# Patient Record
Sex: Male | Born: 1940 | Race: Black or African American | Hispanic: No | Smoking: Never smoker
Health system: Southern US, Community
[De-identification: ages and names within clinical notes are randomized; demographics above are authoritative.]

## PROBLEM LIST (undated history)

## (undated) DIAGNOSIS — C801 Malignant (primary) neoplasm, unspecified: Secondary | ICD-10-CM

## (undated) DIAGNOSIS — I10 Essential (primary) hypertension: Secondary | ICD-10-CM

## (undated) DIAGNOSIS — E119 Type 2 diabetes mellitus without complications: Secondary | ICD-10-CM

## (undated) DIAGNOSIS — C189 Malignant neoplasm of colon, unspecified: Secondary | ICD-10-CM

## (undated) HISTORY — PX: APPENDECTOMY: SHX54

---

## 1985-12-02 DIAGNOSIS — E1140 Type 2 diabetes mellitus with diabetic neuropathy, unspecified: Secondary | ICD-10-CM | POA: Insufficient documentation

## 2016-05-23 DIAGNOSIS — E785 Hyperlipidemia, unspecified: Secondary | ICD-10-CM | POA: Insufficient documentation

## 2016-05-23 DIAGNOSIS — G4700 Insomnia, unspecified: Secondary | ICD-10-CM | POA: Insufficient documentation

## 2016-05-23 DIAGNOSIS — F419 Anxiety disorder, unspecified: Secondary | ICD-10-CM | POA: Insufficient documentation

## 2016-05-23 DIAGNOSIS — Z85038 Personal history of other malignant neoplasm of large intestine: Secondary | ICD-10-CM | POA: Insufficient documentation

## 2016-05-23 DIAGNOSIS — M109 Gout, unspecified: Secondary | ICD-10-CM | POA: Insufficient documentation

## 2016-05-23 DIAGNOSIS — M25569 Pain in unspecified knee: Secondary | ICD-10-CM | POA: Insufficient documentation

## 2016-05-23 DIAGNOSIS — M549 Dorsalgia, unspecified: Secondary | ICD-10-CM

## 2016-05-23 DIAGNOSIS — G8929 Other chronic pain: Secondary | ICD-10-CM | POA: Insufficient documentation

## 2016-07-30 ENCOUNTER — Ambulatory Visit (INDEPENDENT_AMBULATORY_CARE_PROVIDER_SITE_OTHER): Payer: Medicare Other

## 2016-07-30 ENCOUNTER — Encounter: Payer: Self-pay | Admitting: Podiatry

## 2016-07-30 ENCOUNTER — Ambulatory Visit (INDEPENDENT_AMBULATORY_CARE_PROVIDER_SITE_OTHER): Payer: Medicare Other | Admitting: Podiatry

## 2016-07-30 VITALS — BP 146/71 | HR 73 | Resp 16

## 2016-07-30 DIAGNOSIS — B351 Tinea unguium: Secondary | ICD-10-CM

## 2016-07-30 DIAGNOSIS — E119 Type 2 diabetes mellitus without complications: Secondary | ICD-10-CM

## 2016-07-30 DIAGNOSIS — M79676 Pain in unspecified toe(s): Secondary | ICD-10-CM | POA: Diagnosis not present

## 2016-07-30 DIAGNOSIS — Z0189 Encounter for other specified special examinations: Secondary | ICD-10-CM

## 2016-07-30 DIAGNOSIS — M2040 Other hammer toe(s) (acquired), unspecified foot: Secondary | ICD-10-CM | POA: Diagnosis not present

## 2016-07-30 DIAGNOSIS — I739 Peripheral vascular disease, unspecified: Secondary | ICD-10-CM

## 2016-07-30 DIAGNOSIS — I10 Essential (primary) hypertension: Secondary | ICD-10-CM | POA: Insufficient documentation

## 2016-07-30 NOTE — Progress Notes (Signed)
   Subjective:    Patient ID: Chad Benitez., male    DOB: [birth year], 75 y.o.   MRN: [id number]  HPI: He presents today as a 75 year old male with a chief complaint of painful elongated toenails 1 through 5 bilaterally he states that he is needed from [place] and has a history of diabetes usually sees a podiatrist every 3 months. States that he would to consider a pair of diabetic shoes.    Review of Systems  Cardiovascular: Positive for leg swelling.  All other systems reviewed and are negative.      Objective:   Physical Exam: Vital signs are stable alert and oriented 3 pulses are palpable slightly diminished DP bilateral. Neurologic sensorium is intact. Degenerative flexors are intact. Muscle strength +5 over 5 dorsiflexion plantar flexors and inverters everters OF the musculature is intact. Orthopedic evaluation Mr. is all just distal to the ankle for range of motion without crepitation. Orthopedic evaluation shows all joints distal to the ankle for range of motion without crepitation however he does have flexible hammertoe deformities. No open lesions. No pre-ulcerative lesions. Radiographs taken today do not demonstrate any type of osseus abnormalities no fractures are identified. His toenails are elongated sharply incurvated and thickened.        Assessment & Plan:  Pain limb secondary to onychomycosis diabetes. Hammertoe deformities. Peripheral vascular disease.  Plan: Debridement of nails. Started paperwork requesting permission for diabetic shoes.

## 2016-11-01 ENCOUNTER — Ambulatory Visit: Payer: Medicare Other | Admitting: Podiatry

## 2016-11-04 ENCOUNTER — Ambulatory Visit (INDEPENDENT_AMBULATORY_CARE_PROVIDER_SITE_OTHER): Payer: Medicare Other | Admitting: Podiatry

## 2016-11-04 ENCOUNTER — Encounter: Payer: Self-pay | Admitting: Podiatry

## 2016-11-04 VITALS — BP 134/91 | HR 68

## 2016-11-04 DIAGNOSIS — L603 Nail dystrophy: Secondary | ICD-10-CM

## 2016-11-04 DIAGNOSIS — E0843 Diabetes mellitus due to underlying condition with diabetic autonomic (poly)neuropathy: Secondary | ICD-10-CM

## 2016-11-04 DIAGNOSIS — M79609 Pain in unspecified limb: Secondary | ICD-10-CM | POA: Diagnosis not present

## 2016-11-04 DIAGNOSIS — L608 Other nail disorders: Secondary | ICD-10-CM

## 2016-11-04 DIAGNOSIS — B351 Tinea unguium: Secondary | ICD-10-CM | POA: Diagnosis not present

## 2016-11-04 NOTE — Progress Notes (Signed)
SUBJECTIVE Patient with a history of diabetes mellitus presents to office today complaining of elongated, thickened nails. Pain while ambulating in shoes. Patient is unable to trim their own nails.   No Known Allergies  OBJECTIVE General Patient is awake, alert, and oriented x 3 and in no acute distress. Derm Skin is dry and supple bilateral. Negative open lesions or macerations. Remaining integument unremarkable. Nails are tender, long, thickened and dystrophic with subungual debris, consistent with onychomycosis, 1-5 bilateral. No signs of infection noted. Vasc  DP and PT pedal pulses palpable bilaterally. Temperature gradient within normal limits.  Neuro Epicritic and protective threshold sensation diminished bilaterally.  Musculoskeletal Exam No symptomatic pedal deformities noted bilateral. Muscular strength within normal limits.  ASSESSMENT 1. Diabetes Mellitus w/ peripheral neuropathy 2. Onychomycosis of nail due to dermatophyte bilateral 3. Pain in foot bilateral  PLAN OF CARE 1. Patient evaluated today. 2. Instructed to maintain good pedal hygiene and foot care. Stressed importance of controlling blood sugar.  3. Mechanical debridement of nails 1-5 bilaterally performed using a nail nipper. Filed with dremel without incident.  4. Return to clinic in 3 mos.    Brent M Evans, DPM   

## 2016-11-11 ENCOUNTER — Emergency Department (HOSPITAL_COMMUNITY): Payer: Medicare Other

## 2016-11-11 ENCOUNTER — Emergency Department (HOSPITAL_COMMUNITY)
Admission: EM | Admit: 2016-11-11 | Discharge: 2016-11-11 | Disposition: A | Discharge status: Home / Self Care | Payer: Medicare Other | Attending: Emergency Medicine | Admitting: Emergency Medicine

## 2016-11-11 ENCOUNTER — Encounter (HOSPITAL_COMMUNITY): Payer: Self-pay | Admitting: Emergency Medicine

## 2016-11-11 DIAGNOSIS — Y939 Activity, unspecified: Secondary | ICD-10-CM | POA: Diagnosis not present

## 2016-11-11 DIAGNOSIS — Z794 Long term (current) use of insulin: Secondary | ICD-10-CM | POA: Insufficient documentation

## 2016-11-11 DIAGNOSIS — S39012A Strain of muscle, fascia and tendon of lower back, initial encounter: Secondary | ICD-10-CM | POA: Diagnosis not present

## 2016-11-11 DIAGNOSIS — W010XXA Fall on same level from slipping, tripping and stumbling without subsequent striking against object, initial encounter: Secondary | ICD-10-CM | POA: Diagnosis not present

## 2016-11-11 DIAGNOSIS — Y999 Unspecified external cause status: Secondary | ICD-10-CM | POA: Insufficient documentation

## 2016-11-11 DIAGNOSIS — E119 Type 2 diabetes mellitus without complications: Secondary | ICD-10-CM | POA: Insufficient documentation

## 2016-11-11 DIAGNOSIS — S3992XA Unspecified injury of lower back, initial encounter: Secondary | ICD-10-CM | POA: Diagnosis present

## 2016-11-11 DIAGNOSIS — W19XXXA Unspecified fall, initial encounter: Secondary | ICD-10-CM

## 2016-11-11 DIAGNOSIS — I10 Essential (primary) hypertension: Secondary | ICD-10-CM | POA: Insufficient documentation

## 2016-11-11 DIAGNOSIS — Z7982 Long term (current) use of aspirin: Secondary | ICD-10-CM | POA: Insufficient documentation

## 2016-11-11 DIAGNOSIS — Z85038 Personal history of other malignant neoplasm of large intestine: Secondary | ICD-10-CM | POA: Insufficient documentation

## 2016-11-11 DIAGNOSIS — Y929 Unspecified place or not applicable: Secondary | ICD-10-CM | POA: Diagnosis not present

## 2016-11-11 HISTORY — DX: Malignant (primary) neoplasm, unspecified: C80.1

## 2016-11-11 HISTORY — DX: Malignant neoplasm of colon, unspecified: C18.9

## 2016-11-11 HISTORY — DX: Type 2 diabetes mellitus without complications: E11.9

## 2016-11-11 HISTORY — DX: Essential (primary) hypertension: I10

## 2016-11-11 MED ORDER — METHOCARBAMOL 500 MG PO TABS: 500.0000 mg | ORAL_TABLET | Freq: Two times a day (BID) | ORAL | 0 refills | Status: AC

## 2016-11-11 NOTE — ED Provider Notes (Signed)
Medical screening examination/treatment/procedure(s) were conducted as a shared visit with non-physician practitioner(s) and myself.  I personally evaluated the patient during the encounter.   EKG Interpretation None     Patient here complaining of mechanical fall with resultant left knee pain. X-rays negative. Had no loss of consciousness. Denies any head or neck discomfort. We'll discharge was symptomatically treatment   Lacretia Leigh, MD 11/11/16 1500

## 2016-11-11 NOTE — ED Notes (Signed)
Patient is A & O x4.  He understood discharge instructions and follow up care.

## 2016-11-11 NOTE — Discharge Instructions (Signed)
Your symptoms are most consistent with muscular spasms and pain from your fall.   Please take robaxin (muscle relaxer) as prescribed.  Do not drive after you take this medication as it may cause drowsiness.    Please return to emergency department if your symptoms worsen or if you develop numbness, tingling or weakness of your lower extremity, problems urinating like incontinence or retention.    Continue taking your home medications as prescribed.

## 2016-11-11 NOTE — ED Triage Notes (Signed)
Patient reports fall last night while taking the trash out. Patient c/o left knee and lower back pain. Ambulatory to triage. Denies head injury and LOC.

## 2016-11-11 NOTE — ED Provider Notes (Signed)
[facility] DEPT Provider Note   CSN: [id number] Arrival date & time: 11/11/16  1124  History   Chief Complaint Chief Complaint  Patient presents with  . Fall  . Back Pain  . Knee Pain    HPI Chad Benitez. is a 75 y.o. male with pmh as noted below presents with constant "tight" L back pain and L leg pain that started last night after he slipped and fell on his L side. Pt states he was holding two trash bags and slipped and landed on his left side.  Pt states moving trunk and walking exacerbates L back pan and L leg pain. Pt states staying still and not stretching decreases pain.  Pt states he has chronic knee pain, which he takes percocet for daily.  Pt has not taken any pain medications for pain since fall.  Pt denies LOC, chest pain, dizziness, light headedness, head trauma prior to fall.  Pt denies headache, blurry vision, chest pain, shortness of breath, n/v/c/d, extremity n/t/w.  Pt ambulates with cane at home.      HPI  Past Medical History:  Diagnosis Date  . Cancer ([place])   . Colon cancer ([place])   . Diabetes mellitus without complication ([place])   . Hypertension     Patient Active Problem List   Diagnosis Date Noted  . Hypertension 07/30/2016  . Anxiety 05/23/2016  . Chronic back pain 05/23/2016  . Knee pain 05/23/2016  . Gout 05/23/2016  . History of colon cancer 05/23/2016  . Hyperlipidemia 05/23/2016  . Insomnia 05/23/2016  . Type 2 diabetes mellitus with diabetic neuropathy, with long-term current use of insulin ([place]) 12/02/1985    Past Surgical History:  Procedure Laterality Date  . APPENDECTOMY         Home Medications    Prior to Admission medications   Medication Sig Start Date End Date Taking? Authorizing Provider  allopurinol (ZYLOPRIM) 100 MG tablet Take 100 mg by mouth daily.    Yes Historical Provider, MD  aspirin 81 MG chewable tablet Chew by mouth.   Yes Historical Provider, MD  gabapentin (NEURONTIN) 100 MG capsule Take 100 mg by  mouth 3 (three) times daily.  09/10/16 09/10/17 Yes Historical Provider, MD  hydrochlorothiazide (MICROZIDE) 12.5 MG capsule Take by mouth. 09/10/16 09/10/17 Yes Historical Provider, MD  insulin NPH-regular Human (NOVOLIN 70/30) (70-30) 100 UNIT/ML injection Inject 25 Units into the skin 2 (two) times daily.  06/24/16 06/24/17 Yes Historical Provider, MD  lidocaine-prilocaine (EMLA) cream Apply 1-2 grams (1-2 pea sized amounts) to affected area 3-4 times a day as needed for 30 days 08/07/16  Yes Historical Provider, MD  lisinopril (PRINIVIL,ZESTRIL) 20 MG tablet Take by mouth.   Yes Historical Provider, MD  omeprazole (PRILOSEC) 20 MG capsule TAKE 1 CAPSULE BY MOUTH DAILY 07/17/16  Yes Historical Provider, MD  oxyCODONE-acetaminophen (PERCOCET) 10-325 MG tablet Take 1 tablet by mouth every 6 (six) hours as needed for pain.  07/29/16  Yes Historical Provider, MD  QUEtiapine (SEROQUEL) 50 MG tablet Take 50 mg by mouth at bedtime.  07/20/16  Yes Historical Provider, MD  zolpidem (AMBIEN) 5 MG tablet Take 5 mg by mouth at bedtime.  07/20/16  Yes Historical Provider, MD  Diclofenac Sodium 1.5 % SOLN APPLY 40 DROPS TO AFFECTED AREA 3 TO 4 TIMES APPLY DAY 08/07/16   Historical Provider, MD  Glucose Blood (BLOOD GLUCOSE TEST STRIPS) STRP Use as instructed to test blood sugar levels up to three (3) times daily.  DX: E11.40  06/26/16 06/26/17  Historical Provider, MD  glucose monitoring kit (FREESTYLE) monitoring kit by Does not apply route.    Historical Provider, MD  Insulin Syringe-Needle U-100 (INSULIN SYRINGE .3CC/31GX5/16") 31G X 5/16" 0.3 ML MISC by Does not apply route.    Historical Provider, MD  Lancets (FREESTYLE) lancets 1 each by Other route 2 (two) times daily. Use as instructed 06/24/16 06/24/17  Historical Provider, MD  methocarbamol (ROBAXIN) 500 MG tablet Take 1 tablet (500 mg total) by mouth 2 (two) times daily. 11/11/16   Kinnie Feil, PA-C    Family History History reviewed. No pertinent family  history.  Social History Social History  Substance Use Topics  . Smoking status: Never Smoker  . Smokeless tobacco: Never Used  . Alcohol use No     Allergies   Patient has no known allergies.   Review of Systems Review of Systems  All other systems reviewed and are negative.    Physical Exam Updated Vital Signs BP 135/80   Pulse 82   Temp 98.2 F (36.8 C) (Oral)   Resp 16   Ht 6' (1.829 m)   Wt 108 kg   SpO2 97%   BMI 32.28 kg/m   Physical Exam  Constitutional: He is oriented to person, place, and time. Vital signs are normal. He appears well-developed and well-nourished. No distress.  HENT:  Head: Normocephalic and atraumatic.  Eyes: EOM are normal. Pupils are equal, round, and reactive to light.  Neck: Normal range of motion.  No cervical midline tenderness.  No cervical paraspinal muscle tenderness. Full neck ROM without pain.  Cardiovascular: Normal rate, regular rhythm, normal heart sounds and intact distal pulses.   Pulmonary/Chest: Effort normal and breath sounds normal. He has no wheezes.  Abdominal: There is no tenderness.  Musculoskeletal: Normal range of motion.  Increased muscular tone at left thoracic paraspinal muscle c/w spasms.  No midline thoracic or lumbar tenderness.  Negative SLR bilaterally.  Negative Faber bilaterally.  No deformities or evidence of trauma, warmth or edema in upper and lower extremities other than  L knee scar c/w previous knee surgery.   Muscle mass and tone are appropriate and symmetric.  Full active and passive ROM of the spine and extremities including neck, shoulders, elbows, wrists, MCPs, PIPs, PIPs, hip, knee, ankles and toes.  No crepitus noted with ROM.    Lymphadenopathy:    He has no cervical adenopathy.  Neurological: He is alert and oriented to person, place, and time.  Pt is alert and oriented.  Speech and phonation normal.  Thought process coherent.  Strength 5/5 in upper and lower extremities.  Sensation to  light touch intact in upper and lower extremities. Intact finger to nose test. Gait normal, no foot drag. CN I not tested CN II full visual fields  CN III, IV, VI PEERL and EOM intact CN V light touch intact in all 3 divisions of trigeminal nerve CN VII facial nerve movements intact, symmetric CN VIII hearing intact to finger rub CN IX, X no uvula deviation, symmetric soft palate rise CN XI 5/5 SCM and trapezius strength  CN XII Tongue midline with symmetric L/R movement    Skin: Skin is warm and dry.  Psychiatric: He has a normal mood and affect. His behavior is normal.  Nursing note and vitals reviewed.    ED Treatments / Results  Labs (all labs ordered are listed, but only abnormal results are displayed) Labs Reviewed - No data to display  EKG  EKG Interpretation None       Radiology No results found.  Procedures Procedures (including critical care time)  Medications Ordered in ED Medications - No data to display   Initial Impression / Assessment and Plan / ED Course  I have reviewed the triage vital signs and the nursing notes.  Pertinent labs & imaging results that were available during my care of the patient were reviewed by me and considered in my medical decision making (see chart for details).  Clinical Course    75 yo male presents with L back and L pain s/p fall.  Pt symptoms and physical exam findings most c/w musculoskeletal injury.  Doubt cauda equina, epidural abscess.  L knee x-ray negative for acute changes.  Lumbar x-rays not indicated as pt does not have midline tenderness.  CT head not indicated, pt denied head trauma.  Low suspicion for head trauma or bleed given normal neuro exam.  Will treat symptoms symptomatically with rest for 48 hours, mild stretches, robaxin.  Pt ready for discharge.  Pt given ED return instructions, verbalized understanding and agreeable to dispo plan.    Final Clinical Impressions(s) / ED Diagnoses   Final diagnoses:    Fall, initial encounter  Strain of lumbar region, initial encounter    New Prescriptions Discharge Medication List as of 11/11/2016  3:16 PM       Kinnie Feil, PA-C 11/13/16 1826

## 2016-11-11 NOTE — ED Notes (Signed)
Called for triage with no answer

## 2016-11-11 NOTE — ED Notes (Signed)
Patient did not wish to wait for vitals.

## 2016-12-11 ENCOUNTER — Emergency Department (HOSPITAL_COMMUNITY)
Admission: EM | Admit: 2016-12-11 | Discharge: 2016-12-11 | Disposition: A | Discharge status: Home / Self Care | Payer: Medicare Other | Attending: Emergency Medicine | Admitting: Emergency Medicine

## 2016-12-11 ENCOUNTER — Encounter (HOSPITAL_COMMUNITY): Payer: Self-pay | Admitting: Emergency Medicine

## 2016-12-11 DIAGNOSIS — Z85038 Personal history of other malignant neoplasm of large intestine: Secondary | ICD-10-CM | POA: Insufficient documentation

## 2016-12-11 DIAGNOSIS — E119 Type 2 diabetes mellitus without complications: Secondary | ICD-10-CM | POA: Insufficient documentation

## 2016-12-11 DIAGNOSIS — Z79899 Other long term (current) drug therapy: Secondary | ICD-10-CM | POA: Diagnosis not present

## 2016-12-11 DIAGNOSIS — I10 Essential (primary) hypertension: Secondary | ICD-10-CM | POA: Insufficient documentation

## 2016-12-11 DIAGNOSIS — Z7982 Long term (current) use of aspirin: Secondary | ICD-10-CM | POA: Insufficient documentation

## 2016-12-11 DIAGNOSIS — Z760 Encounter for issue of repeat prescription: Secondary | ICD-10-CM | POA: Diagnosis present

## 2016-12-11 DIAGNOSIS — Z794 Long term (current) use of insulin: Secondary | ICD-10-CM | POA: Insufficient documentation

## 2016-12-11 MED ORDER — QUETIAPINE FUMARATE 50 MG PO TABS: 50.0000 mg | ORAL_TABLET | Freq: Every day | ORAL | 0 refills | Status: AC

## 2016-12-11 MED ORDER — LISINOPRIL 20 MG PO TABS: 20.0000 mg | ORAL_TABLET | Freq: Every day | ORAL | 0 refills | Status: AC

## 2016-12-11 MED ORDER — ZOLPIDEM TARTRATE 5 MG PO TABS: 5.0000 mg | ORAL_TABLET | Freq: Every day | ORAL | 0 refills | Status: AC

## 2016-12-11 MED ORDER — QUETIAPINE FUMARATE ER 50 MG PO TB24
50.0000 mg | ORAL_TABLET | Freq: Once | ORAL | Status: AC
  Administered 2016-12-11: 50 mg via ORAL
  Filled 2016-12-11: qty 1

## 2016-12-11 NOTE — ED Triage Notes (Addendum)
Pt states he had a flight from [place] and his bag was lost with his all his prescription medications. Pt takes medication for anxiety and is starting to feel like he is going to be very emotional if he cannot get his medicine. Pt has not contacted his MD at this point. Pt states he is here for anxiety meds. Pt also has service dog with him

## 2016-12-13 NOTE — ED Provider Notes (Signed)
[facility] DEPT Provider Note   CSN: [id number] Arrival date & time: 12/11/16  1816     History   Chief Complaint No chief complaint on file.   HPI Chad Mane. is a 76 y.o. male.  HPI PT with hx of anxiety comes in requesting med refill. Pt just took a flight back to [place] from [place], and his medication bag was left with the wheel chair attendant. Pt has contacted PCP for prompt f/u - but now he is nervous.   Past Medical History:  Diagnosis Date  . Cancer ([place])   . Colon cancer ([place])   . Diabetes mellitus without complication ([place])   . Hypertension     Patient Active Problem List   Diagnosis Date Noted  . Hypertension 07/30/2016  . Anxiety 05/23/2016  . Chronic back pain 05/23/2016  . Knee pain 05/23/2016  . Gout 05/23/2016  . History of colon cancer 05/23/2016  . Hyperlipidemia 05/23/2016  . Insomnia 05/23/2016  . Type 2 diabetes mellitus with diabetic neuropathy, with long-term current use of insulin ([place]) 12/02/1985    Past Surgical History:  Procedure Laterality Date  . APPENDECTOMY         Home Medications    Prior to Admission medications   Medication Sig Start Date End Date Taking? Authorizing Provider  allopurinol (ZYLOPRIM) 100 MG tablet Take 100 mg by mouth daily.     Historical Provider, MD  aspirin 81 MG chewable tablet Chew by mouth.    Historical Provider, MD  Diclofenac Sodium 1.5 % SOLN APPLY 40 DROPS TO AFFECTED AREA 3 TO 4 TIMES APPLY DAY 08/07/16   Historical Provider, MD  gabapentin (NEURONTIN) 100 MG capsule Take 100 mg by mouth 3 (three) times daily.  09/10/16 09/10/17  Historical Provider, MD  Glucose Blood (BLOOD GLUCOSE TEST STRIPS) STRP Use as instructed to test blood sugar levels up to three (3) times daily.  DX: E11.40 06/26/16 06/26/17  Historical Provider, MD  glucose monitoring kit (FREESTYLE) monitoring kit by Does not apply route.    Historical Provider, MD  hydrochlorothiazide (MICROZIDE) 12.5 MG capsule  Take by mouth. 09/10/16 09/10/17  Historical Provider, MD  insulin NPH-regular Human (NOVOLIN 70/30) (70-30) 100 UNIT/ML injection Inject 25 Units into the skin 2 (two) times daily.  06/24/16 06/24/17  Historical Provider, MD  Insulin Syringe-Needle U-100 (INSULIN SYRINGE .3CC/31GX5/16") 31G X 5/16" 0.3 ML MISC by Does not apply route.    Historical Provider, MD  Lancets (FREESTYLE) lancets 1 each by Other route 2 (two) times daily. Use as instructed 06/24/16 06/24/17  Historical Provider, MD  lidocaine-prilocaine (EMLA) cream Apply 1-2 grams (1-2 pea sized amounts) to affected area 3-4 times a day as needed for 30 days 08/07/16   Historical Provider, MD  lisinopril (PRINIVIL,ZESTRIL) 20 MG tablet Take 1 tablet (20 mg total) by mouth daily. 12/11/16   Varney Biles, MD  methocarbamol (ROBAXIN) 500 MG tablet Take 1 tablet (500 mg total) by mouth 2 (two) times daily. 11/11/16   Kinnie Feil, PA-C  omeprazole (PRILOSEC) 20 MG capsule TAKE 1 CAPSULE BY MOUTH DAILY 07/17/16   Historical Provider, MD  oxyCODONE-acetaminophen (PERCOCET) 10-325 MG tablet Take 1 tablet by mouth every 6 (six) hours as needed for pain.  07/29/16   Historical Provider, MD  QUEtiapine (SEROQUEL) 50 MG tablet Take 1 tablet (50 mg total) by mouth at bedtime. 12/11/16   Varney Biles, MD  zolpidem (AMBIEN) 5 MG tablet Take 1 tablet (5 mg total) by mouth at bedtime.  12/11/16   Varney Biles, MD    Family History History reviewed. No pertinent family history.  Social History Social History  Substance Use Topics  . Smoking status: Never Smoker  . Smokeless tobacco: Never Used  . Alcohol use No     Allergies   Patient has no known allergies.   Review of Systems Review of Systems  ROS 10 Systems reviewed and are negative for acute change except as noted in the HPI.     Physical Exam Updated Vital Signs BP 144/64 (BP Location: Right Arm)   Pulse 79   Temp 98.2 F (36.8 C) (Oral)   Resp 16   Ht 6' (1.829 m)   Wt  240 lb (108.9 kg)   SpO2 99%   BMI 32.55 kg/m   Physical Exam  Constitutional: He is oriented to person, place, and time. He appears well-developed.  HENT:  Head: Normocephalic and atraumatic.  Eyes: Conjunctivae and EOM are normal. Pupils are equal, round, and reactive to light.  Neck: Normal range of motion. Neck supple.  Cardiovascular: Normal rate and regular rhythm.   Pulmonary/Chest: Effort normal and breath sounds normal.  Abdominal: Soft. Bowel sounds are normal. He exhibits no distension. There is no tenderness. There is no rebound and no guarding.  Neurological: He is alert and oriented to person, place, and time.  Skin: Skin is warm.  Psychiatric: He has a normal mood and affect.  Nursing note and vitals reviewed.    ED Treatments / Results  Labs (all labs ordered are listed, but only abnormal results are displayed) Labs Reviewed - No data to display  EKG  EKG Interpretation None       Radiology No results found.  Procedures Procedures (including critical care time)  Medications Ordered in ED Medications  QUEtiapine (SEROQUEL XR) 24 hr tablet 50 mg (50 mg Oral Given 12/11/16 2304)     Initial Impression / Assessment and Plan / ED Course  I have reviewed the triage vital signs and the nursing notes.  Pertinent labs & imaging results that were available during my care of the patient were reviewed by me and considered in my medical decision making (see chart for details).  Clinical Course     PT will get refills for 2-3 days. He sounds reasonable.  Final Clinical Impressions(s) / ED Diagnoses   Final diagnoses:  Encounter for medication refill    New Prescriptions Discharge Medication List as of 12/11/2016 10:20 PM       Varney Biles, MD 12/13/16 1614

## 2017-02-07 ENCOUNTER — Ambulatory Visit: Payer: Medicare Other | Admitting: Podiatry

## 2017-02-21 ENCOUNTER — Ambulatory Visit (INDEPENDENT_AMBULATORY_CARE_PROVIDER_SITE_OTHER): Payer: Medicare Other | Admitting: Podiatry

## 2017-02-21 ENCOUNTER — Encounter: Payer: Self-pay | Admitting: Podiatry

## 2017-02-21 DIAGNOSIS — E0843 Diabetes mellitus due to underlying condition with diabetic autonomic (poly)neuropathy: Secondary | ICD-10-CM

## 2017-02-21 DIAGNOSIS — B351 Tinea unguium: Secondary | ICD-10-CM

## 2017-02-21 DIAGNOSIS — M79609 Pain in unspecified limb: Secondary | ICD-10-CM | POA: Diagnosis not present

## 2017-02-21 NOTE — Progress Notes (Signed)
Complaint:  Visit Type: Patient returns to my office for continued preventative foot care services. Complaint: Patient states" my nails have grown long and thick and become painful to walk and wear shoes" Patient has been diagnosed with DM with neuropathy. The patient presents for preventative foot care services. No changes to ROS  Podiatric Exam: Vascular: dorsalis pedis are absent  B/L.posterior tibial pulses are palpable bilateral. Capillary return is immediate. Temperature gradient is WNL. Skin turgor WNL  Sensorium: Normal Semmes Weinstein monofilament test. Normal tactile sensation bilaterally. Nail Exam: Pt has thick disfigured discolored nails with subungual debris noted bilateral entire nail hallux through fifth toenails Ulcer Exam: There is no evidence of ulcer or pre-ulcerative changes or infection. Orthopedic Exam: Muscle tone and strength are WNL. No limitations in general ROM. No crepitus or effusions noted. Foot type and digits show no abnormalities. Bony prominences are unremarkable. Skin: No Porokeratosis. No infection or ulcers  Diagnosis:  Onychomycosis, , Pain in right toe, pain in left toes  Treatment & Plan Procedures and Treatment: Consent by patient was obtained for treatment procedures. The patient understood the discussion of treatment and procedures well. All questions were answered thoroughly reviewed. Debridement of mycotic and hypertrophic toenails, 1 through 5 bilateral and clearing of subungual debris. No ulceration, no infection noted.  Return Visit-Office Procedure: Patient instructed to return to the office for a follow up visit 3 months for continued evaluation and treatment.    Gardiner Barefoot DPM

## 2017-04-11 ENCOUNTER — Other Ambulatory Visit: Payer: Self-pay | Admitting: Nephrology

## 2017-04-11 DIAGNOSIS — N183 Chronic kidney disease, stage 3 unspecified: Secondary | ICD-10-CM

## 2017-04-17 ENCOUNTER — Ambulatory Visit
Admission: RE | Admit: 2017-04-17 | Discharge: 2017-04-17 | Disposition: A | Discharge status: Home / Self Care | Payer: Medicare Other | Source: Ambulatory Visit | Attending: Nephrology | Admitting: Nephrology

## 2017-04-17 DIAGNOSIS — N183 Chronic kidney disease, stage 3 unspecified: Secondary | ICD-10-CM

## 2017-05-10 ENCOUNTER — Encounter (HOSPITAL_COMMUNITY): Payer: Self-pay | Admitting: Emergency Medicine

## 2017-05-10 ENCOUNTER — Emergency Department (HOSPITAL_COMMUNITY)
Admission: EM | Admit: 2017-05-10 | Discharge: 2017-05-10 | Disposition: A | Discharge status: Home / Self Care | Payer: Medicare Other | Attending: Emergency Medicine | Admitting: Emergency Medicine

## 2017-05-10 ENCOUNTER — Emergency Department (HOSPITAL_COMMUNITY): Payer: Medicare Other

## 2017-05-10 DIAGNOSIS — Z7982 Long term (current) use of aspirin: Secondary | ICD-10-CM | POA: Diagnosis not present

## 2017-05-10 DIAGNOSIS — R42 Dizziness and giddiness: Secondary | ICD-10-CM | POA: Insufficient documentation

## 2017-05-10 DIAGNOSIS — Z85038 Personal history of other malignant neoplasm of large intestine: Secondary | ICD-10-CM | POA: Insufficient documentation

## 2017-05-10 DIAGNOSIS — Z794 Long term (current) use of insulin: Secondary | ICD-10-CM | POA: Insufficient documentation

## 2017-05-10 DIAGNOSIS — Z79899 Other long term (current) drug therapy: Secondary | ICD-10-CM | POA: Insufficient documentation

## 2017-05-10 DIAGNOSIS — E1142 Type 2 diabetes mellitus with diabetic polyneuropathy: Secondary | ICD-10-CM | POA: Insufficient documentation

## 2017-05-10 DIAGNOSIS — I10 Essential (primary) hypertension: Secondary | ICD-10-CM

## 2017-05-10 LAB — CBC
HCT: 38.4 % — ABNORMAL LOW (ref 39.0–52.0)
Hemoglobin: 12.1 g/dL — ABNORMAL LOW (ref 13.0–17.0)
MCH: 24 pg — ABNORMAL LOW (ref 26.0–34.0)
MCHC: 31.5 g/dL (ref 30.0–36.0)
MCV: 76.2 fL — AB (ref 78.0–100.0)
PLATELETS: 155 10*3/uL (ref 150–400)
RBC: 5.04 MIL/uL (ref 4.22–5.81)
RDW: 13.3 % (ref 11.5–15.5)
WBC: 8.8 10*3/uL (ref 4.0–10.5)

## 2017-05-10 LAB — COMPREHENSIVE METABOLIC PANEL
ALBUMIN: 4 g/dL (ref 3.5–5.0)
ALT: 13 U/L — AB (ref 17–63)
AST: 23 U/L (ref 15–41)
Alkaline Phosphatase: 68 U/L (ref 38–126)
Anion gap: 9 (ref 5–15)
BUN: 23 mg/dL — ABNORMAL HIGH (ref 6–20)
CALCIUM: 8.6 mg/dL — AB (ref 8.9–10.3)
CO2: 21 mmol/L — ABNORMAL LOW (ref 22–32)
Chloride: 108 mmol/L (ref 101–111)
Creatinine, Ser: 1.26 mg/dL — ABNORMAL HIGH (ref 0.61–1.24)
GFR calc Af Amer: >60 mL/min (ref >60)
GFR calc non Af Amer: 55 mL/min — ABNORMAL LOW (ref >60)
Glucose, Bld: 69 mg/dL (ref 65–99)
Potassium: 3.8 mmol/L (ref 3.5–5.1)
SODIUM: 138 mmol/L (ref 135–145)
TOTAL PROTEIN: 6.5 g/dL (ref 6.5–8.1)
Total Bilirubin: 0.7 mg/dL (ref 0.3–1.2)

## 2017-05-10 LAB — I-STAT TROPONIN, ED
Troponin i, poc: 0.01 ng/mL (ref 0.00–0.08)
Troponin i, poc: 0.03 ng/mL (ref 0.00–0.08)

## 2017-05-10 NOTE — Discharge Instructions (Signed)
Please call your doctor for recheck next week.  Your ekg is abnormal and needs to be compared to an old ekg.  Changes are likely due to your high blood pressure.  Return if you have chest pain.

## 2017-05-10 NOTE — ED Notes (Signed)
Service dog at bedside, [facility]

## 2017-05-10 NOTE — ED Notes (Signed)
Patient given 324mg  ASA by EMS

## 2017-05-10 NOTE — ED Provider Notes (Signed)
[facility] DEPT Provider Note   CSN: [id number] Arrival date & time: 05/10/17  1933     History   Chief Complaint Chief Complaint  Patient presents with  . Dizziness    HPI Chad Benitez. is a 76 y.o. male.  HPI  This is a 76 year old man who had a shingles shot today. Afterwards he arrived home and felt somewhat weak and nauseated. He called EMS secondary to this. He had no associated chest pain, abdominal pain, or dyspnea. EKG was transported as possible code STEMI. I reviewed the EKG and reviewed it with Dr. Tamala Julian. 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 Dr. Tamala Julian was in concurrence. There is no old EKG to compare. On arrival here patient states that he feels improved. He describes it as just feeling very nauseated. He did not vomit. He was not dizzy and had no focal deficits. Patient denies any previous cardiac history  Past Medical History:  Diagnosis Date  . Cancer ([place])   . Colon cancer ([place])   . Diabetes mellitus without complication ([place])   . Hypertension     Patient Active Problem List   Diagnosis Date Noted  . Hypertension 07/30/2016  . Anxiety 05/23/2016  . Chronic back pain 05/23/2016  . Knee pain 05/23/2016  . Gout 05/23/2016  . History of colon cancer 05/23/2016  . Hyperlipidemia 05/23/2016  . Insomnia 05/23/2016  . Type 2 diabetes mellitus with diabetic neuropathy, with long-term current use of insulin ([place]) 12/02/1985    Past Surgical History:  Procedure Laterality Date  . APPENDECTOMY         Home Medications    Prior to Admission medications   Medication Sig Start Date End Date Taking? Authorizing Provider  allopurinol (ZYLOPRIM) 100 MG tablet Take 100 mg by mouth daily.    Yes [provider]  aspirin 81 MG chewable tablet Chew 81 mg by mouth daily.    Yes [provider]  Diclofenac Sodium 1.5 % SOLN APPLY 40 DROPS TO AFFECTED AREA 3 TO 4 TIMES A DAY 08/07/16  Yes [provider]  doxazosin (CARDURA) 1 MG  tablet Take 1 mg by mouth at bedtime. 04/21/17  Yes [provider]  gabapentin (NEURONTIN) 100 MG capsule Take 200 mg by mouth 3 (three) times daily. 02/07/17  Yes [provider]  insulin NPH-regular Human (HUMULIN 70/30) (70-30) 100 UNIT/ML injection Inject 25 Units into the skin 2 (two) times daily with a meal.   Yes [provider]  lidocaine (LIDODERM) 5 % Place 1 patch onto the skin daily. Remove & Discard patch within 12 hours or as directed by MD   Yes [provider]  lisinopril (PRINIVIL,ZESTRIL) 40 MG tablet Take 40 mg by mouth daily. 04/14/17  Yes [provider]  omeprazole (PRILOSEC) 20 MG capsule Take 20 mg by mouth once a day 07/17/16  Yes [provider]  oxyCODONE-acetaminophen (PERCOCET) 10-325 MG tablet Take 1 tablet by mouth every 6 (six) hours.  07/29/16  Yes [provider]  QUEtiapine (SEROQUEL) 50 MG tablet Take 1 tablet (50 mg total) by mouth at bedtime. 12/11/16  Yes Varney Biles, MD  traZODone (DESYREL) 50 MG tablet Take 50 mg by mouth at bedtime.   Yes [provider]  zolpidem (AMBIEN) 5 MG tablet Take 1 tablet (5 mg total) by mouth at bedtime. 12/11/16  Yes [name], [name], MD  Glucose Blood (BLOOD GLUCOSE TEST STRIPS) STRP Use as instructed to test blood sugar levels up to three (3) times  daily.  DX: E11.40 06/26/16 06/26/17  [provider]  glucose monitoring kit (FREESTYLE) monitoring kit by Does not apply route.    [provider]  Insulin Syringe-Needle U-100 (INSULIN SYRINGE .3CC/31GX5/16") 31G X 5/16" 0.3 ML MISC by Does not apply route.    [provider]  Lancets (FREESTYLE) lancets 1 each by Other route 2 (two) times daily. Use as instructed 06/24/16 06/24/17  [provider]  lidocaine-prilocaine (EMLA) cream Apply 1-2 grams (1-2 pea sized amounts) to affected area 3-4 times a day as needed for 30 days 08/07/16   [provider]  lisinopril  (PRINIVIL,ZESTRIL) 20 MG tablet Take 1 tablet (20 mg total) by mouth daily. Patient not taking: Reported on 05/10/2017 12/11/16   Varney Biles, MD  methocarbamol (ROBAXIN) 500 MG tablet Take 1 tablet (500 mg total) by mouth 2 (two) times daily. Patient not taking: Reported on 05/10/2017 11/11/16   Kinnie Feil, PA-C    Family History No family history on file.  Social History Social History  Substance Use Topics  . Smoking status: Never Smoker  . Smokeless tobacco: Never Used  . Alcohol use No     Allergies   Zostavax [zoster vaccine live]   Review of Systems Review of Systems  All other systems reviewed and are negative.    Physical Exam Updated Vital Signs There were no vitals taken for this visit.  Physical Exam  Constitutional: He is oriented to person, place, and time. He appears well-developed and well-nourished.  HENT:  Head: Normocephalic and atraumatic.  Right Ear: External ear normal.  Left Ear: External ear normal.  Eyes: EOM are normal. Pupils are equal, round, and reactive to light.  Neck: Normal range of motion. Neck supple.  Cardiovascular: Normal rate and regular rhythm.   Pulmonary/Chest: Effort normal and breath sounds normal.  Abdominal: Soft. Bowel sounds are normal.  Musculoskeletal: Normal range of motion.  Neurological: He is alert and oriented to person, place, and time. He displays normal reflexes. No cranial nerve deficit. He exhibits normal muscle tone. Coordination normal.  Skin: Skin is warm and dry. Capillary refill takes less than 2 seconds.  Psychiatric: He has a normal mood and affect.  Nursing note and vitals reviewed.    ED Treatments / Results  Labs (all labs ordered are listed, but only abnormal results are displayed) Labs Reviewed  CBC - Abnormal; Notable for the following:       Result Value   Hemoglobin 12.1 (*)    HCT 38.4 (*)    MCV 76.2 (*)    MCH 24.0 (*)    All other components within normal limits    COMPREHENSIVE METABOLIC PANEL - Abnormal; Notable for the following:    CO2 21 (*)    BUN 23 (*)    Creatinine, Ser 1.26 (*)    Calcium 8.6 (*)    ALT 13 (*)    GFR calc non Af Amer 55 (*)    All other components within normal limits  I-STAT TROPOININ, ED  I-STAT TROPOININ, ED    EKG  EKG Interpretation  Date/Time:  Saturday May 10 2017 19:52:51 EDT Ventricular Rate:  81 PR Interval:    QRS Duration: 89 QT Interval:  395 QTC Calculation: 459 R Axis:   70 Text Interpretation:  Sinus rhythm Anteroseptal infarct, 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 EXAM: CHEST  2 VIEW COMPARISON:  No priors. FINDINGS: Unusual expansile appearance of the left first and second ribs anteriorly and laterally. Lung volumes are slightly low. No acute consolidative airspace disease. No pleural effusions. No evidence of pulmonary edema. Heart size is normal. Upper mediastinal contours are within normal limits. Aortic atherosclerosis. IMPRESSION: 1. Unusual appearance of left first and second ribs, likely related to remote trauma. Unfortunately, there are no prior studies to compare with, and if there is any history of malignancy, the possibility of metastatic lesions should be considered. This could be better evaluated with chest CT if of clinical concern. Electronically Signed   By: Vinnie Langton M.D.   On: 05/10/2017 20:50    Procedures Procedures (including critical care time)  Medications Ordered in ED Medications - No data to display   Initial Impression / Assessment and Plan / ED Course  I have reviewed the triage vital signs and the nursing notes.  Pertinent labs & imaging results that were available during my care of the patient were reviewed  by me and considered in my medical decision making (see chart for details).     1 weakness (probably secondary to receiving his IM injection he describes more of a vasovagal type episode 2 abnormal EKG patient without any chest pain here. Troponin and repeat troponin are normal. EKG reviewed with cardiology. Patient instructed regarding follow-up. Patient appears hemodynamically stable for discharge.  Final Clinical Impressions(s) / ED Diagnoses   Final diagnoses:  Dizziness  Hypertension, unspecified type    New Prescriptions New Prescriptions   No medications on file     Pattricia Boss, MD 05/10/17 [id number]

## 2017-05-10 NOTE — ED Triage Notes (Signed)
Patient is from home, went to [facility] and got a shingles vaccine.  Patient states that he became dizzy and had a headache as soon as he got home.  Patient had 10/10 pain but has subsided to a 4/10 at this time.  Patient still with some dizziness.  Patient is CAOx4, equal hand grips and pedal pushes, no slurred speech, no facial droop.

## 2017-05-23 ENCOUNTER — Ambulatory Visit: Payer: Medicare Other | Admitting: Podiatry

## 2017-06-27 ENCOUNTER — Ambulatory Visit (INDEPENDENT_AMBULATORY_CARE_PROVIDER_SITE_OTHER): Payer: Medicare Other | Admitting: Podiatry

## 2017-06-27 ENCOUNTER — Encounter: Payer: Self-pay | Admitting: Podiatry

## 2017-06-27 DIAGNOSIS — E1159 Type 2 diabetes mellitus with other circulatory complications: Secondary | ICD-10-CM

## 2017-06-27 DIAGNOSIS — I739 Peripheral vascular disease, unspecified: Secondary | ICD-10-CM

## 2017-06-27 DIAGNOSIS — M79609 Pain in unspecified limb: Secondary | ICD-10-CM | POA: Diagnosis not present

## 2017-06-27 DIAGNOSIS — B351 Tinea unguium: Secondary | ICD-10-CM | POA: Diagnosis not present

## 2017-06-27 NOTE — Progress Notes (Signed)
Complaint:  Visit Type: Patient returns to my office for continued preventative foot care services. Complaint: Patient states" my nails have grown long and thick and become painful to walk and wear shoes" Patient has been diagnosed with DM with neuropathy. The patient presents for preventative foot care services. No changes to ROS  Podiatric Exam: Vascular: dorsalis pedis are absent  B/L.posterior tibial pulses are palpable bilateral. Capillary return is immediate. Temperature gradient is WNL. Skin turgor WNL  Sensorium: Normal Semmes Weinstein monofilament test. Normal tactile sensation bilaterally. Nail Exam: Pt has thick disfigured discolored nails with subungual debris noted bilateral entire nail hallux through fifth toenails Ulcer Exam: There is no evidence of ulcer or pre-ulcerative changes or infection. Orthopedic Exam: Muscle tone and strength are WNL. No limitations in general ROM. No crepitus or effusions noted. Foot type and digits show no abnormalities. Bony prominences are unremarkable. Skin: No Porokeratosis. No infection or ulcers  Diagnosis:  Onychomycosis, , Pain in right toe, pain in left toes  Treatment & Plan Procedures and Treatment: Consent by patient was obtained for treatment procedures. The patient understood the discussion of treatment and procedures well. All questions were answered thoroughly reviewed. Debridement of mycotic and hypertrophic toenails, 1 through 5 bilateral and clearing of subungual debris. No ulceration, no infection noted.  Return Visit-Office Procedure: Patient instructed to return to the office for a follow up visit 3 months for continued evaluation and treatment.    Gardiner Barefoot DPM

## 2017-08-29 ENCOUNTER — Ambulatory Visit: Payer: Medicare Other | Admitting: Podiatry

## 2017-10-01 ENCOUNTER — Telehealth: Payer: Self-pay | Admitting: *Deleted

## 2017-10-01 NOTE — Telephone Encounter (Signed)
"  [name] with with [name] calling about appointment for this referral for [name] protein paraprotein."   No appointment at this time.  Voicemail message left for [name] that today's scheduler notified and will return call to office.  Chad Benitez

## 2017-10-07 ENCOUNTER — Ambulatory Visit (INDEPENDENT_AMBULATORY_CARE_PROVIDER_SITE_OTHER): Payer: Medicare Other | Admitting: Podiatry

## 2017-10-07 ENCOUNTER — Encounter: Payer: Self-pay | Admitting: Podiatry

## 2017-10-07 DIAGNOSIS — I739 Peripheral vascular disease, unspecified: Secondary | ICD-10-CM

## 2017-10-07 DIAGNOSIS — B351 Tinea unguium: Secondary | ICD-10-CM

## 2017-10-07 DIAGNOSIS — E1159 Type 2 diabetes mellitus with other circulatory complications: Secondary | ICD-10-CM

## 2017-10-07 DIAGNOSIS — M79609 Pain in unspecified limb: Secondary | ICD-10-CM | POA: Diagnosis not present

## 2017-10-07 NOTE — Progress Notes (Signed)
Complaint:  Visit Type: Patient returns to my office for continued preventative foot care services. Complaint: Patient states" my nails have grown long and thick and become painful to walk and wear shoes" Patient has been diagnosed with DM with neuropathy. The patient presents for preventative foot care services. No changes to ROS  Podiatric Exam: Vascular: dorsalis pedis are absent  B/L.posterior tibial pulses are palpable bilateral. Capillary return is immediate. Temperature gradient is WNL. Skin turgor WNL  Sensorium: Normal Semmes Weinstein monofilament test. Normal tactile sensation bilaterally. Nail Exam: Pt has thick disfigured discolored nails with subungual debris noted bilateral entire nail hallux through fifth toenails Ulcer Exam: There is no evidence of ulcer or pre-ulcerative changes or infection. Orthopedic Exam: Muscle tone and strength are WNL. No limitations in general ROM. No crepitus or effusions noted. Foot type and digits show no abnormalities. Bony prominences are unremarkable. Skin: No Porokeratosis. No infection or ulcers  Diagnosis:  Onychomycosis, , Pain in right toe, pain in left toes  Treatment & Plan Procedures and Treatment: Consent by patient was obtained for treatment procedures. The patient understood the discussion of treatment and procedures well. All questions were answered thoroughly reviewed. Debridement of mycotic and hypertrophic toenails, 1 through 5 bilateral and clearing of subungual debris. No ulceration, no infection noted. ABN signed for 2018. Return Visit-Office Procedure: Patient instructed to return to the office for a follow up visit 3 months for continued evaluation and treatment.    Gardiner Barefoot DPM
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
## 2018-01-07 ENCOUNTER — Ambulatory Visit: Payer: Medicare Other | Admitting: Podiatry

## 2018-01-20 ENCOUNTER — Encounter: Payer: Self-pay | Admitting: Podiatry

## 2018-01-20 ENCOUNTER — Ambulatory Visit (INDEPENDENT_AMBULATORY_CARE_PROVIDER_SITE_OTHER): Payer: Medicare Other | Admitting: Podiatry

## 2018-01-20 DIAGNOSIS — M79609 Pain in unspecified limb: Secondary | ICD-10-CM

## 2018-01-20 DIAGNOSIS — B351 Tinea unguium: Secondary | ICD-10-CM | POA: Diagnosis not present

## 2018-01-20 DIAGNOSIS — E1159 Type 2 diabetes mellitus with other circulatory complications: Secondary | ICD-10-CM

## 2018-01-20 DIAGNOSIS — I739 Peripheral vascular disease, unspecified: Secondary | ICD-10-CM

## 2018-01-20 NOTE — Progress Notes (Signed)
Complaint:  Visit Type: Patient returns to my office for continued preventative foot care services. Complaint: Patient states" my nails have grown long and thick and become painful to walk and wear shoes" Patient has been diagnosed with DM with neuropathy. The patient presents for preventative foot care services. No changes to ROS.  Patient had knee replacement on left knee.  Podiatric Exam: Vascular: dorsalis pedis are absent  B/L.posterior tibial pulses are palpable bilateral. Capillary return is immediate. Temperature gradient is WNL. Skin turgor WNL  Sensorium: Normal Semmes Weinstein monofilament test. Normal tactile sensation bilaterally. Nail Exam: Pt has thick disfigured discolored nails with subungual debris noted bilateral entire nail hallux through fifth toenails Ulcer Exam: There is no evidence of ulcer or pre-ulcerative changes or infection. Orthopedic Exam: Muscle tone and strength are WNL. No limitations in general ROM. No crepitus or effusions noted. Foot type and digits show no abnormalities. Bony prominences are unremarkable. Skin: No Porokeratosis. No infection or ulcers  Diagnosis:  Onychomycosis, , Pain in right toe, pain in left toes  Treatment & Plan Procedures and Treatment: Consent by patient was obtained for treatment procedures. The patient understood the discussion of treatment and procedures well. All questions were answered thoroughly reviewed. Debridement of mycotic and hypertrophic toenails, 1 through 5 bilateral and clearing of subungual debris. No ulceration, no infection noted. ABN signed for 2019. Return Visit-Office Procedure: Patient instructed to return to the office for a follow up visit 3 months for continued evaluation and treatment.    Gardiner Barefoot DPM

## 2018-04-21 ENCOUNTER — Ambulatory Visit: Payer: Medicare Other | Admitting: Podiatry
